# Patient Record
Sex: Male | Born: 1955
Health system: Southern US, Community
[De-identification: ages and names within clinical notes are randomized; demographics above are authoritative.]

---

## 2018-11-02 ENCOUNTER — Other Ambulatory Visit
Admission: RE | Admit: 2018-11-02 | Discharge: 2018-11-02 | Disposition: A | Discharge status: Home / Self Care | Payer: Self-pay | Source: Ambulatory Visit | Attending: Family Medicine | Admitting: Family Medicine

## 2018-11-02 DIAGNOSIS — R0789 Other chest pain: Secondary | ICD-10-CM | POA: Insufficient documentation

## 2018-11-02 LAB — BRAIN NATRIURETIC PEPTIDE: B Natriuretic Peptide: 11 pg/mL (ref 0.0–100.0)

## 2018-11-02 LAB — FIBRIN DERIVATIVES D-DIMER (ARMC ONLY): Fibrin derivatives D-dimer (ARMC): 648.64 ng/mL (FEU) — ABNORMAL HIGH (ref 0.00–499.00)

## 2019-08-19 ENCOUNTER — Ambulatory Visit: Payer: Self-pay | Attending: Internal Medicine

## 2019-08-19 DIAGNOSIS — Z23 Encounter for immunization: Secondary | ICD-10-CM | POA: Insufficient documentation

## 2019-08-19 NOTE — Progress Notes (Signed)
   Covid-19 Vaccination Clinic  Name:  Austin Richardson    MRN: [id number] DOB: 11/30/1955  08/19/2019  Austin Richardson was observed post Covid-19 immunization for 15 minutes without incidence. He was provided with Vaccine Information Sheet and instruction to access the V-Safe system.   Austin Richardson was instructed to call 911 with any severe reactions post vaccine: Marland Kitchen Difficulty breathing  . Swelling of your face and throat  . A fast heartbeat  . A bad rash all over your body  . Dizziness and weakness    Immunizations Administered    Name Date Dose VIS Date Route   Moderna COVID-19 Vaccine 08/19/2019 11:56 AM 0.5 mL 05/28/2019 Intramuscular   Manufacturer: Moderna   Lot: [id number]   NDC: 33917-921-78

## 2019-09-17 ENCOUNTER — Ambulatory Visit: Payer: Self-pay | Attending: Internal Medicine

## 2019-09-17 DIAGNOSIS — Z23 Encounter for immunization: Secondary | ICD-10-CM

## 2019-09-17 NOTE — Progress Notes (Signed)
   Covid-19 Vaccination Clinic  Name:  Rayan Ines    MRN: [id number] DOB: [birth year]  09/17/2019  Mr. Bayless was observed post Covid-19 immunization for 15 minutes without incident. He was provided with Vaccine Information Sheet and instruction to access the V-Safe system.   Mr. Siedschlag was instructed to call 911 with any severe reactions post vaccine: Marland Kitchen Difficulty breathing  . Swelling of face and throat  . A fast heartbeat  . A bad rash all over body  . Dizziness and weakness   Immunizations Administered    Name Date Dose VIS Date Route   Moderna COVID-19 Vaccine 09/17/2019  2:22 PM 0.5 mL 05/28/2019 Intramuscular   Manufacturer: Gala Murdoch   Lot: [id number]   NDC: 00174-944-96

## 2020-03-17 DIAGNOSIS — R7303 Prediabetes: Secondary | ICD-10-CM | POA: Diagnosis not present

## 2020-03-17 DIAGNOSIS — E782 Mixed hyperlipidemia: Secondary | ICD-10-CM | POA: Diagnosis not present

## 2020-03-17 DIAGNOSIS — Z136 Encounter for screening for cardiovascular disorders: Secondary | ICD-10-CM | POA: Diagnosis not present

## 2020-03-24 DIAGNOSIS — Z87898 Personal history of other specified conditions: Secondary | ICD-10-CM | POA: Diagnosis not present

## 2020-03-24 DIAGNOSIS — R7303 Prediabetes: Secondary | ICD-10-CM | POA: Diagnosis not present

## 2020-03-24 DIAGNOSIS — N528 Other male erectile dysfunction: Secondary | ICD-10-CM | POA: Diagnosis not present

## 2020-03-24 DIAGNOSIS — Z1211 Encounter for screening for malignant neoplasm of colon: Secondary | ICD-10-CM | POA: Diagnosis not present

## 2020-03-24 DIAGNOSIS — Z8042 Family history of malignant neoplasm of prostate: Secondary | ICD-10-CM | POA: Diagnosis not present

## 2020-04-21 DIAGNOSIS — H524 Presbyopia: Secondary | ICD-10-CM | POA: Diagnosis not present

## 2020-04-21 DIAGNOSIS — Z0100 Encounter for examination of eyes and vision without abnormal findings: Secondary | ICD-10-CM | POA: Diagnosis not present

## 2020-07-10 DIAGNOSIS — Z01818 Encounter for other preprocedural examination: Secondary | ICD-10-CM | POA: Diagnosis not present

## 2020-07-15 DIAGNOSIS — K5730 Diverticulosis of large intestine without perforation or abscess without bleeding: Secondary | ICD-10-CM | POA: Diagnosis not present

## 2020-07-15 DIAGNOSIS — Z1211 Encounter for screening for malignant neoplasm of colon: Secondary | ICD-10-CM | POA: Diagnosis not present

## 2020-07-15 DIAGNOSIS — K640 First degree hemorrhoids: Secondary | ICD-10-CM | POA: Diagnosis not present

## 2020-09-17 DIAGNOSIS — Z125 Encounter for screening for malignant neoplasm of prostate: Secondary | ICD-10-CM | POA: Diagnosis not present

## 2020-09-17 DIAGNOSIS — Z87898 Personal history of other specified conditions: Secondary | ICD-10-CM | POA: Diagnosis not present

## 2020-09-17 DIAGNOSIS — R7303 Prediabetes: Secondary | ICD-10-CM | POA: Diagnosis not present

## 2020-09-17 DIAGNOSIS — Z8042 Family history of malignant neoplasm of prostate: Secondary | ICD-10-CM | POA: Diagnosis not present

## 2020-09-24 ENCOUNTER — Other Ambulatory Visit: Payer: Self-pay | Admitting: Family Medicine

## 2020-09-24 DIAGNOSIS — R7303 Prediabetes: Secondary | ICD-10-CM | POA: Diagnosis not present

## 2020-09-24 DIAGNOSIS — Z136 Encounter for screening for cardiovascular disorders: Secondary | ICD-10-CM

## 2020-09-24 DIAGNOSIS — Z87891 Personal history of nicotine dependence: Secondary | ICD-10-CM

## 2020-09-24 DIAGNOSIS — Z0000 Encounter for general adult medical examination without abnormal findings: Secondary | ICD-10-CM | POA: Diagnosis not present

## 2020-10-28 ENCOUNTER — Ambulatory Visit
Admission: RE | Admit: 2020-10-28 | Discharge: 2020-10-28 | Disposition: A | Discharge status: Home / Self Care | Payer: Medicare HMO | Source: Ambulatory Visit | Attending: Family Medicine | Admitting: Family Medicine

## 2020-10-28 ENCOUNTER — Other Ambulatory Visit: Payer: Self-pay

## 2020-10-28 DIAGNOSIS — Z136 Encounter for screening for cardiovascular disorders: Secondary | ICD-10-CM | POA: Insufficient documentation

## 2020-10-28 DIAGNOSIS — Z0000 Encounter for general adult medical examination without abnormal findings: Secondary | ICD-10-CM

## 2020-10-28 DIAGNOSIS — R7303 Prediabetes: Secondary | ICD-10-CM

## 2020-10-28 DIAGNOSIS — Z87891 Personal history of nicotine dependence: Secondary | ICD-10-CM | POA: Insufficient documentation

## 2021-01-12 DIAGNOSIS — R1032 Left lower quadrant pain: Secondary | ICD-10-CM | POA: Diagnosis not present

## 2021-02-10 DIAGNOSIS — Z03818 Encounter for observation for suspected exposure to other biological agents ruled out: Secondary | ICD-10-CM | POA: Diagnosis not present

## 2021-02-10 DIAGNOSIS — Z20822 Contact with and (suspected) exposure to covid-19: Secondary | ICD-10-CM | POA: Diagnosis not present

## 2021-03-25 DIAGNOSIS — Z0000 Encounter for general adult medical examination without abnormal findings: Secondary | ICD-10-CM | POA: Diagnosis not present

## 2021-03-25 DIAGNOSIS — Z87898 Personal history of other specified conditions: Secondary | ICD-10-CM | POA: Diagnosis not present

## 2021-03-25 DIAGNOSIS — R7303 Prediabetes: Secondary | ICD-10-CM | POA: Diagnosis not present

## 2021-06-04 DIAGNOSIS — Z0100 Encounter for examination of eyes and vision without abnormal findings: Secondary | ICD-10-CM | POA: Diagnosis not present

## 2021-07-14 DIAGNOSIS — Z0100 Encounter for examination of eyes and vision without abnormal findings: Secondary | ICD-10-CM | POA: Diagnosis not present

## 2021-07-14 DIAGNOSIS — H524 Presbyopia: Secondary | ICD-10-CM | POA: Diagnosis not present

## 2022-02-14 IMAGING — US US ABDOMINAL AORTA SCREENING AAA
1 series · 14 of 20 positions shown · non-contrast
Comparison: None.

CLINICAL DATA: Male between 65-75 years of age with a smoking
history.

EXAM:
US ABDOMINAL AORTA MEDICARE SCREENING
TECHNIQUE: Ultrasound examination of the abdominal aorta was performed as a
screening evaluation for abdominal aortic aneurysm.

[Series 1: us abdominal aorta screening aaa · 0.30mm/px · 14 of 20 slices shown]
[im 1/20]
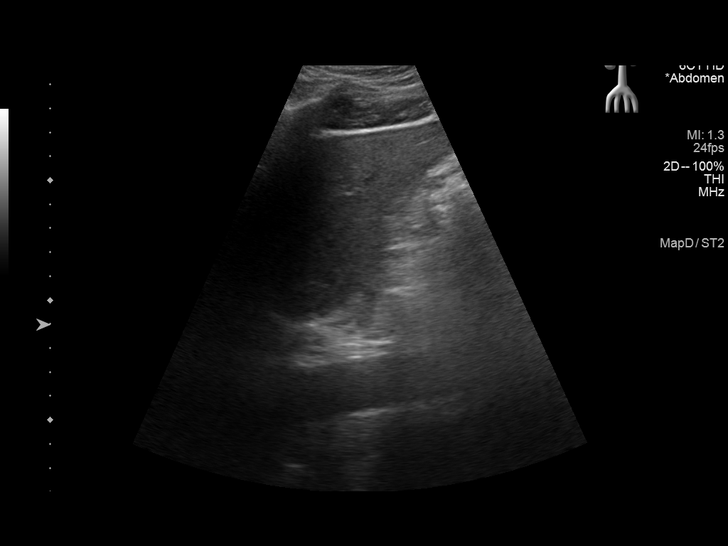
[im 3/20]
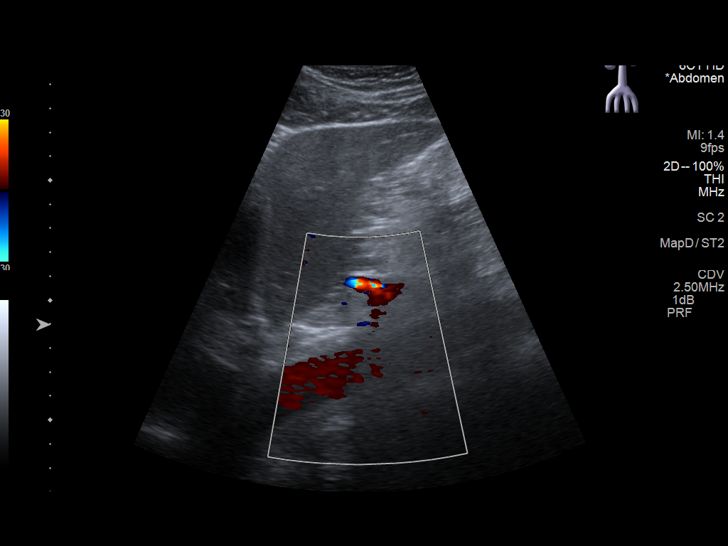
[im 4/20]
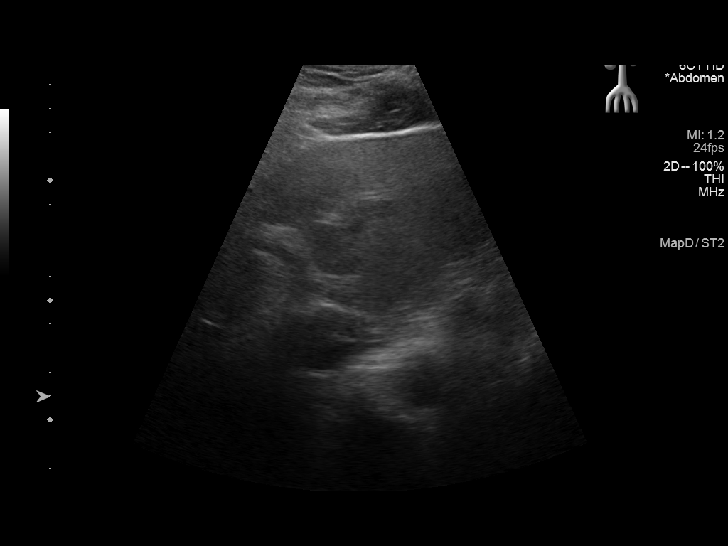
[im 6/20]
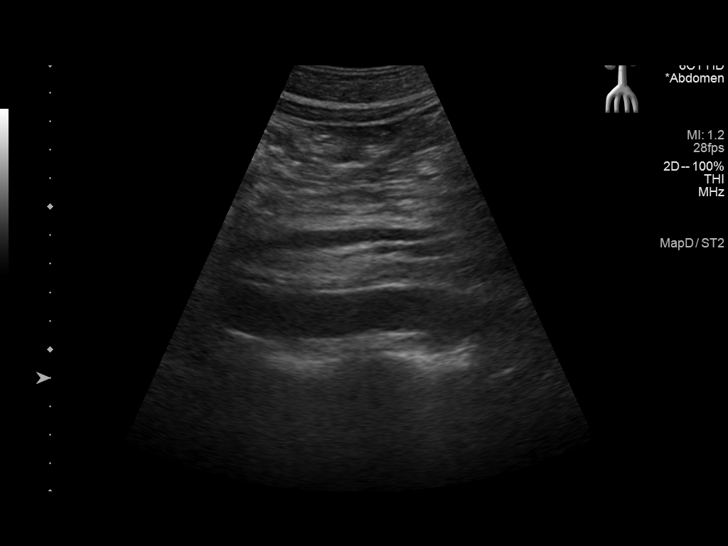
[im 7/20]
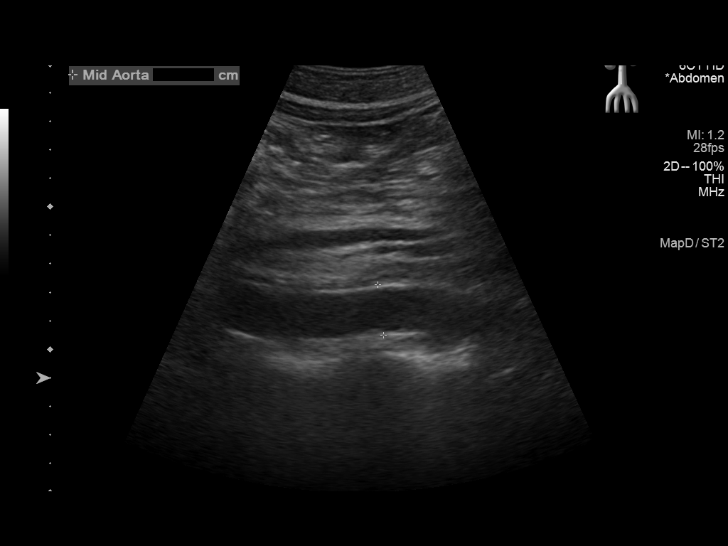
[im 8/20]
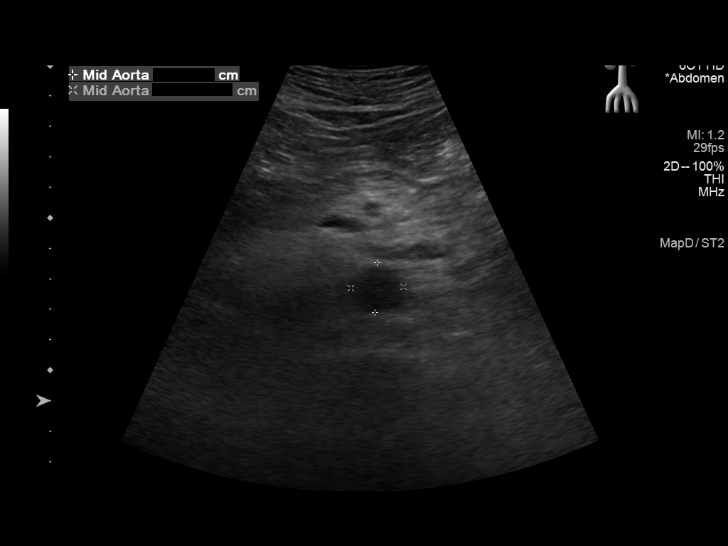
[im 10/20]
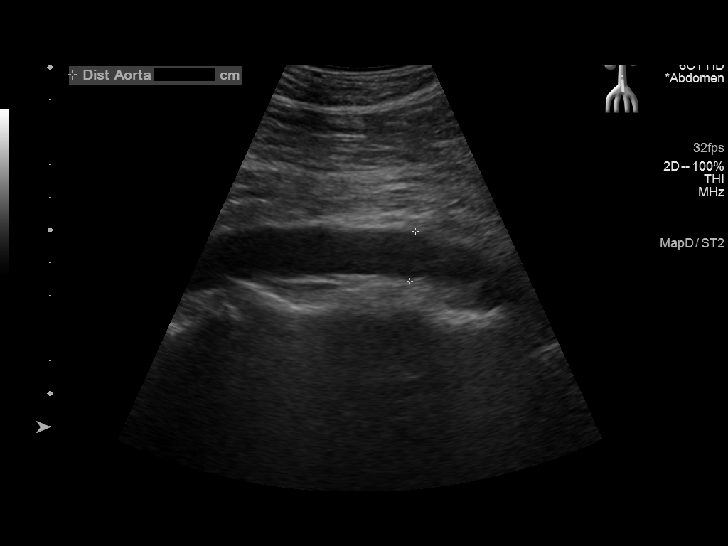
[im 11/20]
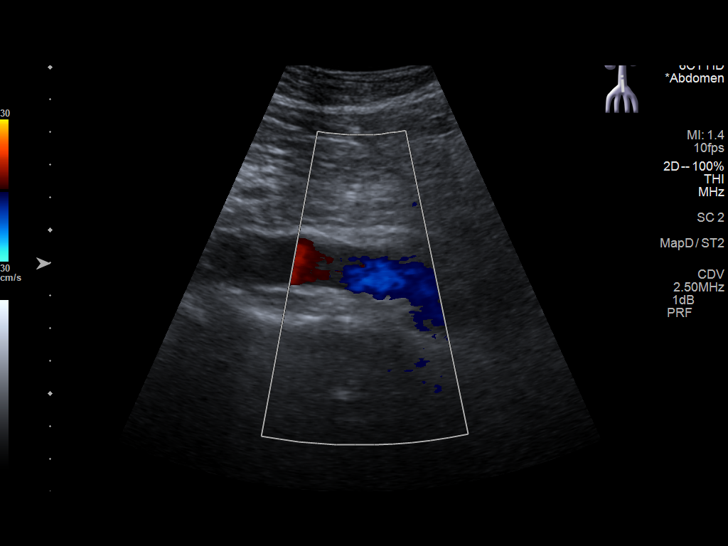
[im 13/20]
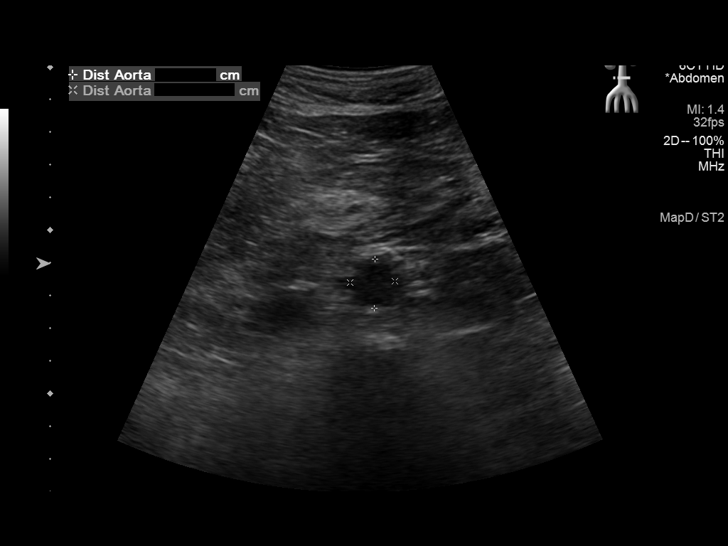
[im 14/20]
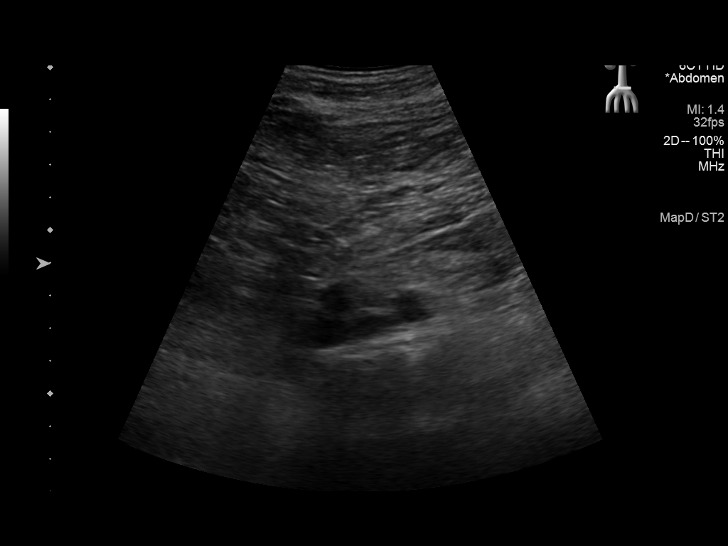
[im 16/20]
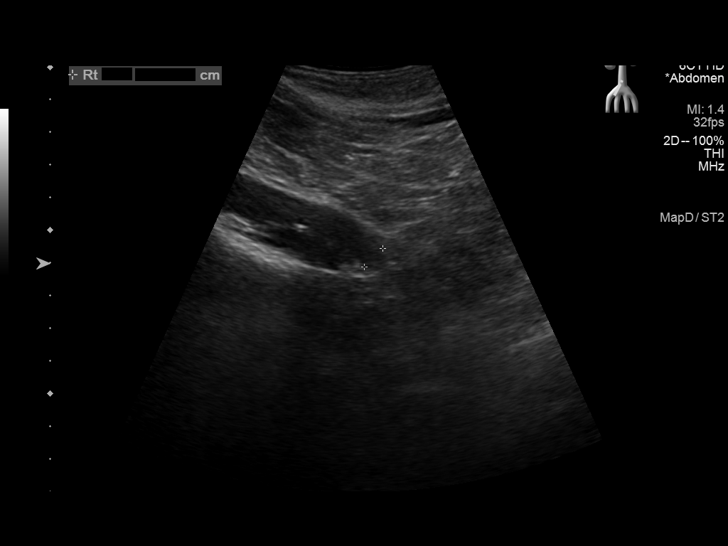
[im 17/20]
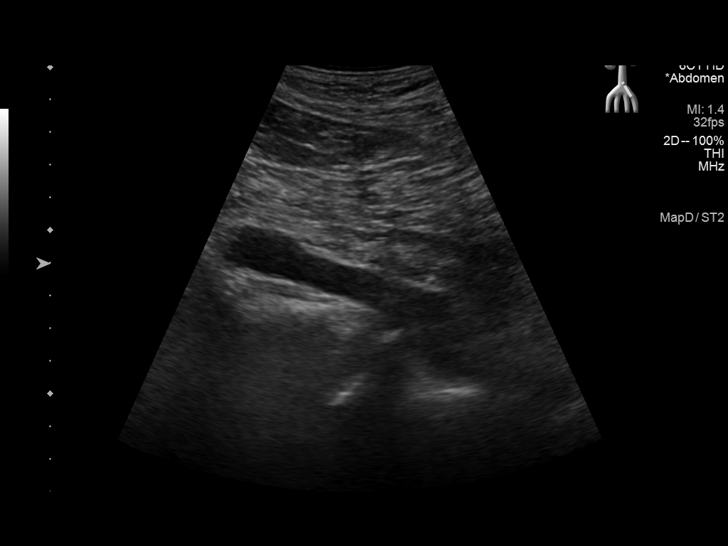
[im 18/20]
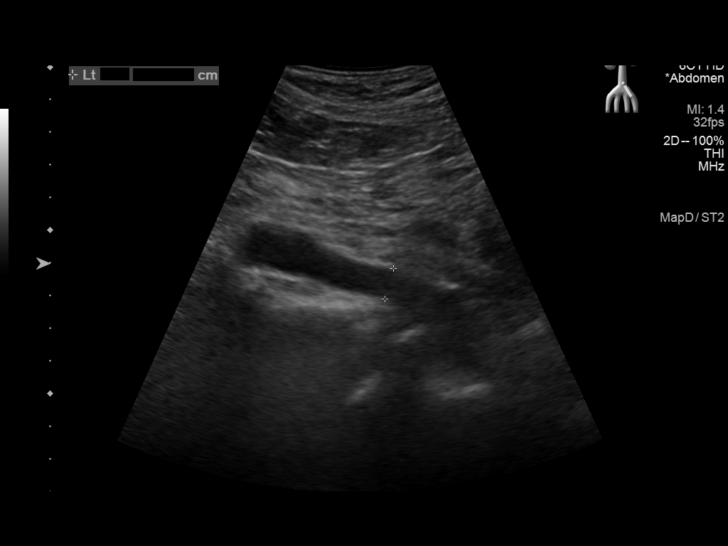
[im 20/20]
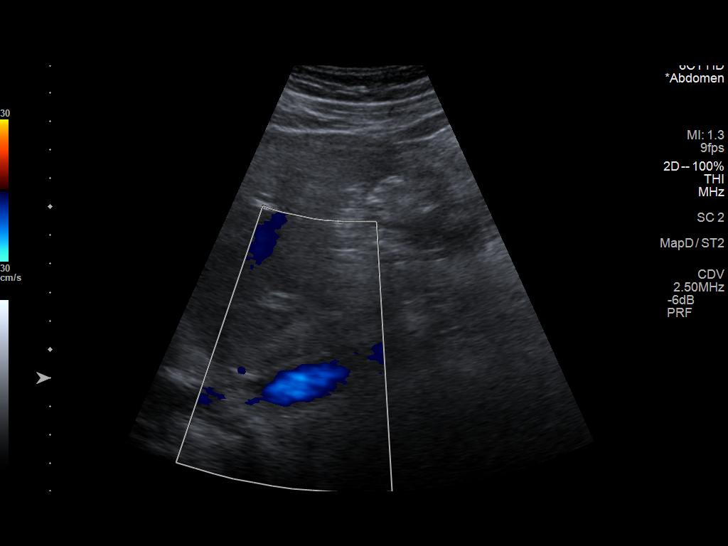

[14 of 20 positions shown; findings below may reference images not displayed]

FINDINGS: Abdominal aortic measurements as follows:

Proximal:  2.1 cm

Mid:  1.6 cm

Distal:  1.5 cm
IMPRESSION: No evidence of abdominal aortic aneurysm.

## 2022-02-18 DIAGNOSIS — J209 Acute bronchitis, unspecified: Secondary | ICD-10-CM | POA: Diagnosis not present

## 2022-03-21 DIAGNOSIS — R7303 Prediabetes: Secondary | ICD-10-CM | POA: Diagnosis not present

## 2022-03-21 DIAGNOSIS — Z136 Encounter for screening for cardiovascular disorders: Secondary | ICD-10-CM | POA: Diagnosis not present

## 2022-03-21 DIAGNOSIS — Z87898 Personal history of other specified conditions: Secondary | ICD-10-CM | POA: Diagnosis not present

## 2022-03-28 DIAGNOSIS — Z1331 Encounter for screening for depression: Secondary | ICD-10-CM | POA: Diagnosis not present

## 2022-03-28 DIAGNOSIS — R899 Unspecified abnormal finding in specimens from other organs, systems and tissues: Secondary | ICD-10-CM | POA: Diagnosis not present

## 2022-03-28 DIAGNOSIS — N289 Disorder of kidney and ureter, unspecified: Secondary | ICD-10-CM | POA: Diagnosis not present

## 2022-03-28 DIAGNOSIS — Z0000 Encounter for general adult medical examination without abnormal findings: Secondary | ICD-10-CM | POA: Diagnosis not present

## 2022-10-04 DIAGNOSIS — R899 Unspecified abnormal finding in specimens from other organs, systems and tissues: Secondary | ICD-10-CM | POA: Diagnosis not present

## 2022-10-04 DIAGNOSIS — N289 Disorder of kidney and ureter, unspecified: Secondary | ICD-10-CM | POA: Diagnosis not present

## 2022-10-11 DIAGNOSIS — N529 Male erectile dysfunction, unspecified: Secondary | ICD-10-CM | POA: Diagnosis not present

## 2022-10-11 DIAGNOSIS — M79672 Pain in left foot: Secondary | ICD-10-CM | POA: Diagnosis not present

## 2022-10-11 DIAGNOSIS — Z125 Encounter for screening for malignant neoplasm of prostate: Secondary | ICD-10-CM | POA: Diagnosis not present

## 2022-10-11 DIAGNOSIS — M25562 Pain in left knee: Secondary | ICD-10-CM | POA: Diagnosis not present

## 2022-10-11 DIAGNOSIS — Z87898 Personal history of other specified conditions: Secondary | ICD-10-CM | POA: Diagnosis not present

## 2022-10-11 DIAGNOSIS — Z136 Encounter for screening for cardiovascular disorders: Secondary | ICD-10-CM | POA: Diagnosis not present

## 2023-01-20 DIAGNOSIS — H524 Presbyopia: Secondary | ICD-10-CM | POA: Diagnosis not present

## 2023-01-20 DIAGNOSIS — Z0100 Encounter for examination of eyes and vision without abnormal findings: Secondary | ICD-10-CM | POA: Diagnosis not present

## 2023-04-05 DIAGNOSIS — Z125 Encounter for screening for malignant neoplasm of prostate: Secondary | ICD-10-CM | POA: Diagnosis not present

## 2023-04-05 DIAGNOSIS — N528 Other male erectile dysfunction: Secondary | ICD-10-CM | POA: Diagnosis not present

## 2023-04-05 DIAGNOSIS — Z136 Encounter for screening for cardiovascular disorders: Secondary | ICD-10-CM | POA: Diagnosis not present

## 2023-04-05 DIAGNOSIS — Z87898 Personal history of other specified conditions: Secondary | ICD-10-CM | POA: Diagnosis not present

## 2023-04-13 DIAGNOSIS — Z0000 Encounter for general adult medical examination without abnormal findings: Secondary | ICD-10-CM | POA: Diagnosis not present

## 2023-04-13 DIAGNOSIS — Z1331 Encounter for screening for depression: Secondary | ICD-10-CM | POA: Diagnosis not present

## 2024-01-05 DIAGNOSIS — H00012 Hordeolum externum right lower eyelid: Secondary | ICD-10-CM | POA: Diagnosis not present

## 2024-04-16 DIAGNOSIS — Z125 Encounter for screening for malignant neoplasm of prostate: Secondary | ICD-10-CM | POA: Diagnosis not present

## 2024-04-16 DIAGNOSIS — Z136 Encounter for screening for cardiovascular disorders: Secondary | ICD-10-CM | POA: Diagnosis not present

## 2024-04-16 DIAGNOSIS — Z87898 Personal history of other specified conditions: Secondary | ICD-10-CM | POA: Diagnosis not present

## 2024-04-23 DIAGNOSIS — Z1331 Encounter for screening for depression: Secondary | ICD-10-CM | POA: Diagnosis not present

## 2024-04-23 DIAGNOSIS — Z136 Encounter for screening for cardiovascular disorders: Secondary | ICD-10-CM | POA: Diagnosis not present

## 2024-04-23 DIAGNOSIS — Z87898 Personal history of other specified conditions: Secondary | ICD-10-CM | POA: Diagnosis not present

## 2024-04-23 DIAGNOSIS — Z0000 Encounter for general adult medical examination without abnormal findings: Secondary | ICD-10-CM | POA: Diagnosis not present

## 2024-04-23 DIAGNOSIS — Z125 Encounter for screening for malignant neoplasm of prostate: Secondary | ICD-10-CM | POA: Diagnosis not present
